# Patient Record
Sex: Male | Born: 1990 | Race: White | Marital: Single | Smoking: Current some day smoker
Health system: Southern US, Community
[De-identification: ages and names within clinical notes are randomized; demographics above are authoritative.]

## PROBLEM LIST (undated history)

## (undated) HISTORY — PX: KNEE SURGERY: SHX244

---

## 2012-03-13 ENCOUNTER — Ambulatory Visit
Admission: RE | Admit: 2012-03-13 | Discharge: 2012-03-13 | Disposition: A | Discharge status: Home / Self Care | Payer: No Typology Code available for payment source | Source: Ambulatory Visit | Attending: Occupational Medicine | Admitting: Occupational Medicine

## 2012-03-13 ENCOUNTER — Other Ambulatory Visit: Payer: Self-pay | Admitting: Occupational Medicine

## 2012-03-13 DIAGNOSIS — Z021 Encounter for pre-employment examination: Secondary | ICD-10-CM

## 2014-05-27 ENCOUNTER — Encounter (HOSPITAL_COMMUNITY): Payer: Self-pay | Admitting: Emergency Medicine

## 2014-05-27 ENCOUNTER — Emergency Department (HOSPITAL_COMMUNITY)
Admission: EM | Admit: 2014-05-27 | Discharge: 2014-05-27 | Disposition: A | Discharge status: Home / Self Care | Payer: Worker's Compensation | Attending: Emergency Medicine | Admitting: Emergency Medicine

## 2014-05-27 DIAGNOSIS — Z23 Encounter for immunization: Secondary | ICD-10-CM | POA: Insufficient documentation

## 2014-05-27 DIAGNOSIS — W268XXA Contact with other sharp object(s), not elsewhere classified, initial encounter: Secondary | ICD-10-CM | POA: Diagnosis not present

## 2014-05-27 DIAGNOSIS — S61209A Unspecified open wound of unspecified finger without damage to nail, initial encounter: Secondary | ICD-10-CM | POA: Insufficient documentation

## 2014-05-27 DIAGNOSIS — S61219A Laceration without foreign body of unspecified finger without damage to nail, initial encounter: Secondary | ICD-10-CM

## 2014-05-27 DIAGNOSIS — F17200 Nicotine dependence, unspecified, uncomplicated: Secondary | ICD-10-CM | POA: Diagnosis not present

## 2014-05-27 DIAGNOSIS — Y9269 Other specified industrial and construction area as the place of occurrence of the external cause: Secondary | ICD-10-CM | POA: Diagnosis not present

## 2014-05-27 DIAGNOSIS — Y9389 Activity, other specified: Secondary | ICD-10-CM | POA: Insufficient documentation

## 2014-05-27 MED ORDER — TETANUS-DIPHTH-ACELL PERTUSSIS 5-2.5-18.5 LF-MCG/0.5 IM SUSP
0.5000 mL | Freq: Once | INTRAMUSCULAR | Status: AC
  Administered 2014-05-27: 0.5 mL via INTRAMUSCULAR
  Filled 2014-05-27: qty 0.5

## 2014-05-27 MED ORDER — LIDOCAINE HCL (PF) 2 % IJ SOLN
INTRAMUSCULAR | Status: AC
  Filled 2014-05-27: qty 2

## 2014-05-27 MED ORDER — LIDOCAINE HCL (CARDIAC) 20 MG/ML IV SOLN: 10.0000 mL | Freq: Once | INTRAVENOUS | Status: DC

## 2014-05-27 MED ORDER — LIDOCAINE HCL (PF) 1 % IJ SOLN: 5.0000 mL | Freq: Once | INTRAMUSCULAR | Status: DC

## 2014-05-27 NOTE — ED Provider Notes (Signed)
CSN: [id number]     Arrival date & time 05/27/14  0129 History   First MD Initiated Contact with Patient 05/27/14 0146     Chief Complaint  Patient presents with  . 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 He is concerned that it may need sutures. He denies that it is continuing to bleed. He denies that he has any pain or that he is unable to move it. He is UTD on his tetanus shot.   History reviewed. No pertinent past medical history. History reviewed. No pertinent past surgical history. No family history on file. History  Substance Use Topics  . Smoking status: Current Some Day Smoker  . Smokeless tobacco: Not on file  . Alcohol Use: Yes    Review of Systems  All other systems reviewed and are negative.     Allergies  Review of patient's allergies indicates no known allergies.  Home Medications   Prior to Admission medications   Not on File   BP 132/79  Pulse 87  Temp(Src) 97.7 F (36.5 C) (Oral)  Resp 17  SpO2 94% Physical Exam  Nursing note and vitals reviewed. Constitutional: He appears well-developed and well-nourished. No distress.  HENT:  Head: Normocephalic and atraumatic.  Eyes: Pupils are equal, round, and reactive to light.  Neck: Normal range of motion. Neck supple.  Cardiovascular: Normal rate and regular rhythm.   Pulmonary/Chest: Effort normal.  Abdominal: Soft.  Musculoskeletal:       Hands: CR < 2 seconds Radial pulses are strong and symmetrical  Neurological: He is alert.  Skin: Skin is warm and dry.    ED Course  Procedures (including critical care time) Labs Review Labs Reviewed - No data to display  Imaging Review No results found.   EKG Interpretation None      MDM   Final diagnoses:  Finger laceration, initial encounter    LACERATION REPAIR Performed by: Dorthula Matas Authorized by: Dorthula Matas Consent: Verbal consent obtained. Risks and benefits: risks, benefits and alternatives were discussed Consent given by: patient Patient identity confirmed: provided demographic data Prepped and Draped in normal sterile fashion Wound explored  Laceration Location: left distal tip index finger  Laceration Length: 1.5 cm  No Foreign Bodies seen or palpated  Anesthesia: local infiltration  Local anesthetic: lidocaine 2% wo epinephrine  Anesthetic total: 3 ml  Irrigation method: syringe Amount of cleaning: standard  Skin closure: sutures  Number of sutures: 5  Technique: simple interrupted  Patient tolerance: Patient tolerated the procedure well with no immediate complications.  UTD tetanus, wound closed, cleaned and wound care instructions given. He will f/u with health clinic with [employer]. Sutures to be removed in 7-10 days  23 y.o.Spencer Gutierrez's evaluation in the Emergency Department is complete. It has been determined that no acute conditions requiring further emergency intervention are present at this time. The patient/guardian have been advised of the diagnosis and plan. We have discussed signs and symptoms that warrant return to the ED, such as changes or worsening in symptoms.  Vital signs are stable at discharge. Filed Vitals:   05/27/14 0207  BP: 132/79  Pulse: 87  Temp: 97.7 F (36.5 C)  Resp: 17    Patient/guardian has voiced understanding and agreed to follow-up with the PCP or specialist.     Dorthula Matas, PA-C 05/30/14 1533

## 2014-05-27 NOTE — ED Notes (Signed)
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

## 2014-05-27 NOTE — ED Notes (Signed)
Sutures in place on index finger of left hand placed by Tiffany, PA, C

## 2014-05-27 NOTE — Discharge Instructions (Signed)

## 2014-05-27 NOTE — ED Notes (Signed)
Tiffany, PA, at the bedside with suture cart

## 2014-05-30 NOTE — ED Provider Notes (Signed)
Medical screening examination/treatment/procedure(s) were performed by non-physician practitioner and as supervising physician I was immediately available for consultation/collaboration.   EKG Interpretation None        Tomasita Crumble, MD 05/30/14 1542

## 2014-11-03 ENCOUNTER — Emergency Department (HOSPITAL_COMMUNITY): Payer: Worker's Compensation

## 2014-11-03 ENCOUNTER — Encounter (HOSPITAL_COMMUNITY): Payer: Self-pay | Admitting: Emergency Medicine

## 2014-11-03 ENCOUNTER — Emergency Department (HOSPITAL_COMMUNITY)
Admission: EM | Admit: 2014-11-03 | Discharge: 2014-11-04 | Disposition: A | Discharge status: Home / Self Care | Payer: Worker's Compensation | Attending: Emergency Medicine | Admitting: Emergency Medicine

## 2014-11-03 DIAGNOSIS — S6992XA Unspecified injury of left wrist, hand and finger(s), initial encounter: Secondary | ICD-10-CM | POA: Diagnosis not present

## 2014-11-03 DIAGNOSIS — S0003XA Contusion of scalp, initial encounter: Secondary | ICD-10-CM | POA: Insufficient documentation

## 2014-11-03 DIAGNOSIS — Z880 Allergy status to penicillin: Secondary | ICD-10-CM | POA: Insufficient documentation

## 2014-11-03 DIAGNOSIS — Y998 Other external cause status: Secondary | ICD-10-CM | POA: Diagnosis not present

## 2014-11-03 DIAGNOSIS — Y92410 Unspecified street and highway as the place of occurrence of the external cause: Secondary | ICD-10-CM | POA: Insufficient documentation

## 2014-11-03 DIAGNOSIS — Z720 Tobacco use: Secondary | ICD-10-CM | POA: Diagnosis not present

## 2014-11-03 DIAGNOSIS — Y939 Activity, unspecified: Secondary | ICD-10-CM | POA: Diagnosis not present

## 2014-11-03 DIAGNOSIS — M79642 Pain in left hand: Secondary | ICD-10-CM

## 2014-11-03 DIAGNOSIS — S0990XA Unspecified injury of head, initial encounter: Secondary | ICD-10-CM | POA: Diagnosis present

## 2014-11-03 NOTE — ED Notes (Signed)
Pt denies any other injuries.
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
## 2014-11-04 NOTE — Discharge Instructions (Signed)
1. Medications: ibuprofen for pain, usual home medications 2. Treatment: rest, drink plenty of fluids, use ice 3. Follow Up: Please followup with your primary doctor in 3 days for discussion of your diagnoses and further evaluation after today's visit; if you do not have a primary care doctor use the resource guide provided to find one; Please return to the ER for worsening symptoms, severe headaches, vomiting, confusion or lethargy    Emergency Department Resource Guide 1) Find a Doctor and Pay Out of Pocket Although you won't have to find out who is covered by your insurance plan, it is a good idea to ask around and get recommendations. You will then need to call the office and see if the doctor you have chosen will accept you as a new patient and what types of options they offer for patients who are self-pay. Some doctors offer discounts or will set up payment plans for their patients who do not have insurance, but you will need to ask so you aren't surprised when you get to your appointment.  2) Contact Your Local Health Department Not all health departments have doctors that can see patients for sick visits, but many do, so it is worth a call to see if yours does. If you don't know where your local health department is, you can check in your phone book. The CDC also has a tool to help you locate your state's health department, and many state websites also have listings of all of their local health departments.  3) Find a Walk-in Clinic If your illness is not likely to be very severe or complicated, you may want to try a walk in clinic. These are popping up all over the country in pharmacies, drugstores, and shopping centers. They're usually staffed by nurse practitioners or physician assistants that have been trained to treat common illnesses and complaints. They're usually fairly quick and inexpensive. However, if you have serious medical issues or chronic medical problems, these are probably not  your best option.  No Primary Care Doctor: - Call Health Connect at  [phone] - they can help you locate a primary care doctor that  accepts your insurance, provides certain services, etc. - Physician Referral Service- [phone]  Chronic Pain Problems: Organization         Address  Phone   Notes  [facility]  [phone] Patients need to be referred by their primary care doctor.   Medication Assistance: Organization         Address  Phone   Notes  [facility] Medication [facility] [address] [place], [place] [address] [phone] --Must be a resident of [facility] -- Must have NO insurance coverage whatsoever (no Medicaid/ Medicare, etc.) -- The pt. MUST have a primary care doctor that directs their care regularly and follows them in the community   [facility]ist  [phone]   [employer]  [phone]    Agencies that provide inexpensive medical care: Organization         Address  Phone   Notes  [facility]  [phone]   [facility]    [phone]   [facility] [address] [place], [place] [address] [phone]   [facility] [address] [place], [place] [phone]   Planned Parenthood    [phone]   [facility]    [phone]   [facility]  [address], [place] Phone:  [phone](336) [phone], Fax:  [phone] Hours of Operation:  9 am - 6 pm, M-F.  Also accepts Medicaid/Medicare and self-pay.  [facility]  [address], [place] Phone: [phone]) [phone], Fax: [phone](336) [phone]. Hours of Operation:  8:30 am - 5:30 pm, M-F.  Also accepts Medicaid and self-pay.  [facility] [place] [address], [place] Phone: [phone](336) [phone]   [facility] [address], [place], [place] [phone], Ext. [phone] Mondays & Thursdays: 7-9 AM.  First  15 patients are seen on a first come, first serve basis.    Medicaid-accepting [facility] Providers:  Organization         Address  Phone   Notes  [facility] [address], [place] [phone]) [phone] Also accepts self-pay patients.  [facility] [address], [place]  [phone]) [phone]   [facility] [address], [place] [phone]) [phone]   [facility] [address] [place] [address], [place] [phone]) [phone]   Renaye RakersVeita Bland [address], [place]   [phone]) [phone] Only accepts [place] Access [place] patients after they have their name applied to their card.   Self-Pay (no insurance) in [facility]:  Organization         Address  Phone   Notes  Sickle Cell Patients, [facility] [address], [place] [phone](336) [phone]   [facility] [address], [place] [phone]   [facility]  [address], [place] [phone]   [facility]/Dr. Osei-Bonsu  [address] [place] [address], [place] or [phone], [place] [phone] Phone number for both [place] and [place] locations is the same.  [facility] [address], [place] [phone]) [phone]   [facility] [place] [address] [place] [address], [place] or [address] [phone]) [phone]   [facility] [address], [place] [phone], phone; [phone]) [phone], fax Sees patients 1st and 3rd Saturday of every month.  Must not qualify for public or private insurance (i.e. Medicaid, Medicare, [place] Health Choice, Veterans' Benefits)  Household income should be no more than 200% of the poverty level The clinic cannot treat you if you are pregnant or think you are pregnant  Sexually transmitted diseases are not treated at the clinic.    Dental  Care: Organization         Address  Phone  Notes  [facility] [address], [place] [phone]) [phone] Accepts children up to age 26 who are enrolled in [place] or [place] Health Choice; pregnant women with a Medicaid card; and children who have applied for Medicaid or [place] Health Choice, but were declined, whose parents can pay a reduced fee at time of service.  [facility] of [facility] [place]  [address], [place] [phone]) [phone] Accepts children up to age 26 who are enrolled in [place] or [place] Health Choice; pregnant women with a Medicaid card; and children who have applied for Medicaid or [place] Health Choice, but were declined, whose parents can pay a reduced fee at time of service.  [facility]  [address]  Mardene Speak [phone] Patients are seen by appointment only. Walk-ins are not accepted. [place] will see patients 41 years of age and older. Monday - Tuesday (8am-5pm) Most Wednesdays (8:30-5pm) $30 per visit, cash only  [facility] Adult Dental Access PROGRAM  [address], [facility] [phone] Patients are seen by appointment only. Walk-ins are not accepted. [place] will see patients 39 years of age and older. One Wednesday Evening (Monthly: Volunteer Based).  $30 per visit, cash only  [place]  [phone] for adults; Children under age 55, call Graduate Pediatric Dentistry at [phone]. Children aged 51-14, please call [phone] to request a pediatric application.  Dental services are provided in all areas of dental care including fillings, crowns and bridges, complete and partial dentures, implants, gum treatment, root canals, and extractions. Preventive care is also provided. Treatment is provided to both adults and children. Patients are selected via a lottery and there is often a waiting list.   [facility] [address], [place]  [phone] [email or web address]   [facility] [address] [place], [place] [phone], Ext. [phone] Second and Fourth Thursday of each month, opens at 6:30 AM; Clinic ends at 9 AM.  Patients are seen on a first-come first-served basis, and a limited number are seen during each clinic.   [facility]  [address] [place], [place] [phone]   Eligibility Requirements You must have lived in [place], [place], or [place] counties for at least the last three months.   You cannot be eligible for state or federal sponsored [employer], including [employer], [place], or [employer].   You generally cannot be eligible for healthcare insurance through your employer.    How to apply: Eligibility screenings are held every Tuesday and Wednesday afternoon from 1:00 pm until 4:00 pm. You do not need an appointment for the interview!  [facility] [address], [place], [place]   [place]  [place] Department  [place]  [phone]    Behavioral Health Resources in the Community: Intensive Outpatient Programs Organization         Address  Phone  Notes  [place]. [address], [place], [place] [phone]   [facility] Outpatient [address] [place], [place]   ADS: [facility] [address], [place], [place]   [place] [address]  [place], [place] or [phone]   Substance Abuse Resources Organization         Address  Phone  Notes  Alcohol and Drug Services  [phone]   [place]  [phone]   [facility]   Chinita Pester  [phone]   Residential & Outpatient Substance Abuse Program  [phone]    Psychological Services Organization         Address  Phone  Notes  [facility]  [place]  [phone]   [place] [address] [place] or [phone]    Mobile Crisis Teams Organization         Address  Phone  Notes  [facility], Mobile Crisis Care Unit  [phone]   [facility]  [address] [place], [place]   [place] Levels  Interior [phone]    Self-Help/Support Groups Organization         Address  Phone             Notes  [facility] [place] - variety of support groups  [place] Call for more information  Narcotics Anonymous (NA), [facility] [address], [employer] [place]  2 meetings at this location   Special educational needs teacher         Address  Phone  Notes  [facility] [place],    [place]  [phone]   [facility]  [address], [place] [phone], [place], [place]   [place], [place] [phone] Admissions: 8am-3pm M-F  Incentives Substance [place] [address]    [place], [place] [phone]   [facility] [address], [place], [place]   [facility] [address]  [place], [place]   [facility] - Intensive Outpatient [address], [place], [place]   [facility] ([place].) [place].,  [place], [place] 1-8383540756 or [phone]   [facility] (RTS) [address], [place], [place] Accepts Medicaid  [facility] [place] [address]  [place] 1-2280100179 Substance Abuse/Addiction Treatment   [facility] Organization         Address  Phone  Notes  [facility]  [phone]   Domenic Schwab, PhD [address] [place], [place]   [phone] or [phone]   [address] [place], [place] [phone]   [facility] [address] [place], [place] [phone] Insurance/Medicaid/sponsorship through [facility] and Families [address], [address] [place]                                    [place], [place] [phone] [place] [address], [place] [phone]    Dr. Adele Schilder  [phone]   [facility] Dept. 1) [address] [place] 2) [place] 3)  [place] 65, [place] 325 004 [phone]  [phone]   [place] [phone] or [phone] (After Hours)

## 2017-02-11 ENCOUNTER — Encounter (HOSPITAL_BASED_OUTPATIENT_CLINIC_OR_DEPARTMENT_OTHER): Payer: Self-pay | Admitting: Emergency Medicine

## 2017-02-11 ENCOUNTER — Emergency Department (HOSPITAL_BASED_OUTPATIENT_CLINIC_OR_DEPARTMENT_OTHER)
Admission: EM | Admit: 2017-02-11 | Discharge: 2017-02-11 | Disposition: A | Discharge status: Home / Self Care | Payer: Worker's Compensation | Attending: Emergency Medicine | Admitting: Emergency Medicine

## 2017-02-11 ENCOUNTER — Emergency Department (HOSPITAL_BASED_OUTPATIENT_CLINIC_OR_DEPARTMENT_OTHER): Payer: Worker's Compensation

## 2017-02-11 DIAGNOSIS — M25512 Pain in left shoulder: Secondary | ICD-10-CM | POA: Diagnosis not present

## 2017-02-11 DIAGNOSIS — Y929 Unspecified place or not applicable: Secondary | ICD-10-CM | POA: Diagnosis not present

## 2017-02-11 DIAGNOSIS — X509XXA Other and unspecified overexertion or strenuous movements or postures, initial encounter: Secondary | ICD-10-CM | POA: Insufficient documentation

## 2017-02-11 DIAGNOSIS — Y999 Unspecified external cause status: Secondary | ICD-10-CM | POA: Insufficient documentation

## 2017-02-11 DIAGNOSIS — F17200 Nicotine dependence, unspecified, uncomplicated: Secondary | ICD-10-CM | POA: Diagnosis not present

## 2017-02-11 DIAGNOSIS — Y939 Activity, unspecified: Secondary | ICD-10-CM | POA: Insufficient documentation

## 2017-02-11 DIAGNOSIS — S4992XA Unspecified injury of left shoulder and upper arm, initial encounter: Secondary | ICD-10-CM | POA: Diagnosis present

## 2017-02-11 MED ORDER — IBUPROFEN 800 MG PO TABS: 800.0000 mg | ORAL_TABLET | Freq: Three times a day (TID) | ORAL | 0 refills | Status: AC | PRN

## 2017-02-11 NOTE — ED Notes (Signed)
Patient transported to X-ray 

## 2017-02-11 NOTE — ED Triage Notes (Signed)
Injury to left shoulder last night while pulling someone out of a car. Pt appears to have full ROM but with pain.  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 Denies need for UDS.

## 2017-02-11 NOTE — ED Notes (Signed)
Pt discharged to home NAD.  

## 2017-02-11 NOTE — Discharge Instructions (Signed)

## 2017-02-11 NOTE — ED Provider Notes (Signed)
Emergency Department Provider Note   I have reviewed the triage vital signs and the nursing notes.  By signing my name below, I, Spencer Gutierrez, attest that this documentation has been prepared under the direction and in the presence of [name], Arlyss Repress, MD. Electronically Signed: Deland Gutierrez, ED Scribe. 02/11/17. 8:49 PM.   HISTORY  Chief Complaint Shoulder Injury  HPI Comments: Spencer Gutierrez is a 26 y.o. male who presents to the Emergency Department complaining of left shoulder pain that radiated down the length of his arm s/p injury that occurred yesterday. The pt reports that he was pulling someone out of his car when the person jerked away from him, and he felt his arm twist the wrong way. Pt has associated mild numbness, tingling, and neck pain. Pt denies fever.  History reviewed. No pertinent past medical history.  There are no active problems to display for this patient.   Past Surgical History:  Procedure Laterality Date  . KNEE SURGERY        Allergies Ceclor [cefaclor] and Penicillins  No family history on file.  Social History Social History  Substance Use Topics  . Smoking status: Current Some Day Smoker  . Smokeless tobacco: Never Used  . Alcohol use Yes     Comment: occ    Review of Systems  Constitutional: No fever/chills Eyes: No visual changes. ENT: No sore throat. Cardiovascular: Denies chest pain. Respiratory: Denies shortness of breath. Gastrointestinal: No abdominal pain.  No nausea, no vomiting.  No diarrhea.  No constipation. Genitourinary: Negative for dysuria. Musculoskeletal: Negative for back pain. Positve for shoulder pain. Skin: Negative for rash. Neurological: Negative for headaches, focal weakness or numbness.  10-point ROS otherwise negative.  ____________________________________________   PHYSICAL EXAM:  VITAL SIGNS: ED Triage Vitals  Enc Vitals Group     BP 02/11/17 1954 136/82     Pulse Rate 02/11/17 1954  67     Resp 02/11/17 1954 17     Temp 02/11/17 1954 98.1 F (36.7 C)     Temp Source 02/11/17 1954 Oral     SpO2 02/11/17 1954 100 %     Weight 02/11/17 1954 185 lb (83.9 kg)     Height 02/11/17 1954 5\' 11"  (1.803 m)     Pain Score 02/11/17 2001 2   Constitutional: Alert and oriented. Well appearing and in no acute distress. Eyes: Conjunctivae are normal. Head: Atraumatic. Nose: No congestion/rhinnorhea. Mouth/Throat: Mucous membranes are moist.  Neck: No stridor.   Cardiovascular: Normal rate, regular rhythm. Good peripheral circulation. Grossly normal heart sounds.   Respiratory: Normal respiratory effort.  No retractions. Lungs CTAB. Musculoskeletal: No lower extremity tenderness nor edema. No gross deformities of extremities. Positive soreness with ROM but ROM is full. No focal tenderness to palpation. Able to resist adduction with left shoulder.  Neurologic:  Normal speech and language. No gross focal neurologic deficits are appreciated.  Skin:  Skin is warm, dry and intact. No rash noted.  ____________________________________________  DIAGNOSTIC STUDIES: Oxygen Saturation is 100% on RA, normal by my interpretation.   COORDINATION OF CARE: 8:49 PM-Discussed next steps with pt. Pt verbalized understanding and is agreeable with the plan.   ____________________________________________  RADIOLOGY  Dg Shoulder Left  Result Date: 02/11/2017 CLINICAL DATA:  Acute onset of left shoulder pain EXAM: LEFT SHOULDER - 2+ VIEW COMPARISON:  None. FINDINGS: There is no evidence of fracture or dislocation. There is no evidence of arthropathy or other focal bone abnormality. Soft tissues are unremarkable. IMPRESSION:  Negative. Electronically Signed   By: Jasmine PangKim  Fujinaga M.D.   On: 02/11/2017 20:33    ____________________________________________   PROCEDURES  Procedure(s) performed:   Procedures  None ____________________________________________   INITIAL IMPRESSION / ASSESSMENT  AND PLAN / ED COURSE  Pertinent labs & imaging results that were available during my care of the patient were reviewed by me and considered in my medical decision making (see chart for details).  Patient presents to the ED with left shoulder soreness. No evidence of fracture or dislocation on plain film. Patient with normal strength in the shoulder Possible rotator cuff strain but doubt full tare. Referred to sports medicine for f/u. Patient to follow up with work regarding return to duty. Disc of radiology provided.   At this time, I do not feel there is any life-threatening condition present. I have reviewed and discussed all results (EKG, imaging, lab, urine as appropriate), exam findings with patient. I have reviewed nursing notes and appropriate previous records.  I feel the patient is safe to be discharged home without further emergent workup. Discussed usual and customary return precautions. Patient and family (if present) verbalize understanding and are comfortable with this plan.  Patient will follow-up with their primary care provider. If they do not have a primary care provider, information for follow-up has been provided to them. All questions have been answered.  ____________________________________________  FINAL CLINICAL IMPRESSION(S) / ED DIAGNOSES  Final diagnoses:  Acute pain of left shoulder     MEDICATIONS GIVEN DURING THIS VISIT:  Medications - No data to display   NEW OUTPATIENT MEDICATIONS STARTED DURING THIS VISIT:  Discharge Medication List as of 02/11/2017  8:50 PM    START taking these medications   Details  ibuprofen (ADVIL,MOTRIN) 800 MG tablet Take 1 tablet (800 mg total) by mouth every 8 (eight) hours as needed., Starting Sun 02/11/2017, Print       I personally performed the services described in this documentation, which was scribed in my presence. The recorded information has been reviewed and is accurate.   Note:  This document was prepared using  Dragon voice recognition software and may include unintentional dictation errors.  Alona BeneJoshua Dalani Mette, MD Emergency Medicine    Satoru Milich, Arlyss RepressJoshua G, MD 02/12/17 1024

## 2017-08-15 ENCOUNTER — Other Ambulatory Visit: Payer: Self-pay | Admitting: Nurse Practitioner

## 2017-08-15 ENCOUNTER — Ambulatory Visit
Admission: RE | Admit: 2017-08-15 | Discharge: 2017-08-15 | Disposition: A | Discharge status: Home / Self Care | Payer: Worker's Compensation | Source: Ambulatory Visit | Attending: Nurse Practitioner | Admitting: Nurse Practitioner

## 2017-08-15 DIAGNOSIS — M25612 Stiffness of left shoulder, not elsewhere classified: Secondary | ICD-10-CM

## 2017-08-15 DIAGNOSIS — M25512 Pain in left shoulder: Secondary | ICD-10-CM

## 2017-09-15 ENCOUNTER — Encounter (HOSPITAL_COMMUNITY): Payer: Self-pay | Admitting: Emergency Medicine

## 2017-09-15 ENCOUNTER — Emergency Department (HOSPITAL_COMMUNITY): Payer: 59

## 2017-09-15 ENCOUNTER — Other Ambulatory Visit: Payer: Self-pay

## 2017-09-15 ENCOUNTER — Emergency Department (HOSPITAL_COMMUNITY)
Admission: EM | Admit: 2017-09-15 | Discharge: 2017-09-15 | Disposition: A | Discharge status: Home / Self Care | Payer: 59 | Attending: Emergency Medicine | Admitting: Emergency Medicine

## 2017-09-15 DIAGNOSIS — G8929 Other chronic pain: Secondary | ICD-10-CM | POA: Insufficient documentation

## 2017-09-15 DIAGNOSIS — M545 Low back pain, unspecified: Secondary | ICD-10-CM

## 2017-09-15 DIAGNOSIS — M419 Scoliosis, unspecified: Secondary | ICD-10-CM

## 2017-09-15 DIAGNOSIS — F17290 Nicotine dependence, other tobacco product, uncomplicated: Secondary | ICD-10-CM | POA: Insufficient documentation

## 2017-09-15 DIAGNOSIS — M62830 Muscle spasm of back: Secondary | ICD-10-CM | POA: Insufficient documentation

## 2017-09-15 DIAGNOSIS — R110 Nausea: Secondary | ICD-10-CM | POA: Insufficient documentation

## 2017-09-15 MED ORDER — CYCLOBENZAPRINE HCL 10 MG PO TABS: 10.0000 mg | ORAL_TABLET | Freq: Three times a day (TID) | ORAL | 0 refills | Status: AC | PRN

## 2017-09-15 MED ORDER — CYCLOBENZAPRINE HCL 10 MG PO TABS
10.0000 mg | ORAL_TABLET | Freq: Once | ORAL | Status: AC
Start: 2017-09-15 — End: 2017-09-15
  Administered 2017-09-15: 10 mg via ORAL
  Filled 2017-09-15: qty 1

## 2017-09-15 MED ORDER — KETOROLAC TROMETHAMINE 30 MG/ML IJ SOLN
30.0000 mg | Freq: Once | INTRAMUSCULAR | Status: AC
  Administered 2017-09-15: 30 mg via INTRAMUSCULAR
  Filled 2017-09-15: qty 1

## 2017-09-15 MED ORDER — ONDANSETRON 4 MG PO TBDP: 4.0000 mg | ORAL_TABLET | Freq: Three times a day (TID) | ORAL | 0 refills | Status: AC | PRN

## 2017-09-15 MED ORDER — HYDROCODONE-ACETAMINOPHEN 5-325 MG PO TABS
1.0000 | ORAL_TABLET | Freq: Once | ORAL | Status: AC
  Administered 2017-09-15: 1 via ORAL
  Filled 2017-09-15: qty 1

## 2017-09-15 MED ORDER — HYDROCODONE-ACETAMINOPHEN 5-325 MG PO TABS: 1.0000 | ORAL_TABLET | Freq: Four times a day (QID) | ORAL | 0 refills | Status: AC | PRN

## 2017-09-15 MED ORDER — NAPROXEN 500 MG PO TABS: 500.0000 mg | ORAL_TABLET | Freq: Two times a day (BID) | ORAL | 0 refills | Status: AC | PRN

## 2017-09-15 MED ORDER — ONDANSETRON 8 MG PO TBDP
8.0000 mg | ORAL_TABLET | Freq: Once | ORAL | Status: AC
  Administered 2017-09-15: 8 mg via ORAL
  Filled 2017-09-15: qty 1

## 2017-09-15 NOTE — ED Notes (Signed)
Pt in radiology at this time. 

## 2017-09-15 NOTE — ED Provider Notes (Signed)
[facility]-EMERGENCY DEPT Provider Note   CSN: [id number] Arrival date & time: 09/15/17  1915     History   Chief Complaint Chief Complaint  Patient presents with  . Back Pain    HPI Spencer Gutierrez is a 27 y.o. male with a PMHx of chronic back pain and nephrolithiasis, who presents to the ED with complaints of acute on chronic low back pain.  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  Over the last 2 days his pain has worsened.  He describes the pain as 8/10 constant sharp/dull/throbbing nonradiating lower back pain that worsens with movement and has been minimally improved with an old Vicodin prescription, Biofreeze, and Tylenol.  He reports mild nausea due to the pain.  He states that his gear for work weighs about 35 pounds, so he does heavy lifting on a daily basis.  He denies any recent trauma or injuries.  He denies history of cancer or IV drug use.  He has been seen by a chiropractor in the past, but has not had an x-ray or any imaging of his back.  He denies that this feels like prior kidney stones.  He denies fevers, chills, CP, SOB, abd pain, V/D/C, hematuria, dysuria, incontinence of urine/stool, saddle anesthesia/cauda equina symptoms, myalgias, arthralgias, numbness, tingling, focal weakness, or any other complaints at this time.    The history is provided by the patient and medical records. No language interpreter was used.  Back Pain   This is a chronic problem. The current episode started 2 days ago. The problem occurs constantly. The problem has not changed since onset.The pain is associated with lifting heavy objects. The pain is present in the lumbar spine. Quality: sharp dull throbbing. The pain does not radiate. The pain is at a severity of 8/10. The pain is moderate. The pain is the same all the time.  Pertinent negatives include no chest pain, no fever, no numbness, no abdominal pain, no bowel incontinence, no perianal numbness, no bladder incontinence, no dysuria, no paresthesias, no paresis, no tingling and no weakness. Treatments tried: biofreeze, tylenol, and old vicodin rx. The treatment provided mild relief.    History reviewed. No pertinent past medical history.  There are no active problems to display for this patient.   Past Surgical History:  Procedure Laterality Date  . KNEE SURGERY         Home Medications    Prior to Admission medications   Medication Sig Start Date End Date Taking? Authorizing Provider  ibuprofen (ADVIL,MOTRIN) 800 MG tablet Take 1 tablet (800 mg total) by mouth every 8 (eight) hours as needed. 02/11/17   Long, Arlyss Repress, MD    Family History History reviewed. No pertinent family history.  Social History Social History   Tobacco Use  . Smoking status: Current Some Day Smoker    Types: Cigars  . Smokeless tobacco: Never Used  Substance Use Topics  . Alcohol use: Yes    Comment: occ  . Drug use: No     Allergies   Ceclor [cefaclor] and Penicillins   Review of Systems Review of Systems  Constitutional: Negative for chills and fever.  Respiratory: Negative for shortness of breath.   Cardiovascular: Negative for chest pain.  Gastrointestinal: Positive for nausea. Negative for abdominal pain, bowel incontinence, constipation, diarrhea and vomiting.  Genitourinary: Negative for bladder incontinence, difficulty urinating (no incontinence), dysuria and hematuria.  Musculoskeletal: Positive for back pain. Negative for arthralgias and myalgias.  Skin: Negative for color change.  Allergic/Immunologic: Negative for immunocompromised state.  Neurological: Negative for tingling, weakness, numbness and paresthesias.  Psychiatric/Behavioral: Negative for confusion.   All other systems reviewed and are negative for acute change except as noted in  the HPI.    Physical Exam Updated Vital Signs BP (!) 139/91 (BP Location: Right Arm)   Pulse 94   Temp 98.6 F (37 C) (Oral)   Resp 18   Ht 5\' 10"  (1.778 m)   Wt 81.6 kg (180 lb)   SpO2 98%   BMI 25.83 kg/m   Physical Exam  Constitutional: He is oriented to person, place, and time. Vital signs are normal. He appears well-developed and well-nourished.  Non-toxic appearance. No distress.  Afebrile, nontoxic, NAD  HENT:  Head: Normocephalic and atraumatic.  Mouth/Throat: Oropharynx is clear and moist and mucous membranes are normal.  Eyes: Conjunctivae and EOM are normal. Right eye exhibits no discharge. Left eye exhibits no discharge.  Neck: Normal range of motion. Neck supple.  Cardiovascular: Normal rate, regular rhythm, normal heart sounds and intact distal pulses. Exam reveals no gallop and no friction rub.  No murmur heard. Pulmonary/Chest: Effort normal and breath sounds normal. No respiratory distress. He has no decreased breath sounds. He has no wheezes. He has no rhonchi. He has no rales.  Abdominal: Soft. Normal appearance and bowel sounds are normal. He exhibits no distension. There is no tenderness. There is no rigidity, no rebound, no guarding, no CVA tenderness, no tenderness at McBurney's point and negative Murphy's sign.  Musculoskeletal: Normal range of motion.       Lumbar back: He exhibits tenderness, bony tenderness and spasm. He exhibits normal range of motion and no deformity.  Lumbar spine with FROM intact with mild diffuse spinous process TTP, no bony stepoffs or deformities, with mild b/l paraspinous muscle TTP and muscle spasms. Strength and sensation grossly intact in all extremities, negative SLR bilaterally, gait steady and nonantalgic. No overlying skin changes. Distal pulses intact.   Neurological: He is alert and oriented to person, place, and time. He has normal strength. No sensory deficit.  Skin: Skin is warm, dry and intact. No rash noted.    Psychiatric: He has a normal mood and affect.  Nursing note and vitals reviewed.    ED Treatments / Results  Labs (all labs ordered are listed, but only abnormal results are displayed) Labs Reviewed - No data to display  EKG  EKG Interpretation None       Radiology Dg Lumbar Spine Complete  Result Date: 09/15/2017 CLINICAL DATA:  Lower back pain for 2 days. EXAM: LUMBAR SPINE - COMPLETE 4+ VIEW COMPARISON:  None. FINDINGS: There is no evidence of lumbar spine fracture. There is scoliosis of spine. Intervertebral disc spaces are maintained. IMPRESSION: Scoliosis of spine.  No acute abnormality noted. Electronically Signed   By: Sherian Rein M.D.   On: 09/15/2017 20:09    Procedures Procedures (including critical care time)  Medications Ordered in ED Medications  ketorolac (TORADOL) 30 MG/ML injection 30 mg (30 mg Intramuscular Given 09/15/17 2006)  cyclobenzaprine (FLEXERIL) tablet 10 mg (10 mg Oral Given 09/15/17 2006)  HYDROcodone-acetaminophen (NORCO/VICODIN) 5-325 MG per tablet 1 tablet (1 tablet Oral Given 09/15/17 2006)  ondansetron (ZOFRAN-ODT) disintegrating tablet 8 mg (8 mg Oral Given 09/15/17 2006)     Initial Impression /  Assessment and Plan / ED Course  I have reviewed the triage vital signs and the nursing notes.  Pertinent labs & imaging results that were available during my care of the patient were reviewed by me and considered in my medical decision making (see chart for details).     27 y.o. male here with acute on chronic low back pain worse x2 days; works as a Emergency planning/management officerpolice officer, and does a lot of heavy lifting daily. Had an injury 7747yrs ago and hurt his back, has had issues every since. On exam, no abdominal or flank tenderness, mild diffuse midline lumbar spinal tenderness and mild paraspinous muscle TTP and spasms. No red flag s/s of low back pain. No s/s of central cord compression or cauda equina. Lower extremities are neurovascularly intact and patient is  ambulating without difficulty. No urinary complaints. Will get xray but doubt need for labs/other imaging studies/etc. Will give pain meds, zofran, and reassess shortly  9:29 PM Pt feeling much better. Xray negative aside from scoliosis. Acute pain likely muscle strain, doubt need for further emergent work up at this time. Will send home with zofran rx, as well as meds/instructions outlined below:  Patient was counseled on back pain precautions and told to do activity as tolerated but do not lift, push, or pull heavy objects more than 10 pounds for the next week. Patient counseled to use ice or heat on back for no longer than 15 minutes every hour.   Rx given for muscle relaxer and counseled on proper use of muscle relaxant medication. Urged patient not to drink alcohol, drive, or perform any other activities that requires focus while taking these medications. Rx for naprosyn given. Rx given for narcotic pain medicine and counseled on proper use of narcotic pain medications. Told that they can increase to every 4 hrs if needed while pain is worse. Counseled not to combine this medication with others containing tylenol. Counseled about not operating machinery or drinking alcohol, etc, while taking this medication.    Patient urged to follow-up with PCP in 1-2wks for recheck, or sooner if pain does not improve with treatment and rest or if pain becomes recurrent. Urged to return with worsening severe pain, loss of bowel or bladder control, trouble walking. The patient verbalizes understanding and agrees with the plan.   [place] reviewed prior to dispensing controlled substance medications, and 2 year search was notable for: none found. Risks/benefits/alternatives and expectations discussed regarding controlled substances. Side effects of medications discussed. Informed consent obtained.    Final Clinical Impressions(s) / ED Diagnoses   Final diagnoses:  Chronic midline low back pain without  sciatica  Muscle spasm of back  Nausea  Scoliosis of lumbar spine, unspecified scoliosis type    ED Discharge Orders        Ordered    ondansetron (ZOFRAN ODT) 4 MG disintegrating tablet  Every 8 hours PRN     09/15/17 2129    cyclobenzaprine (FLEXERIL) 10 MG tablet  3 times daily PRN     09/15/17 2129    naproxen (NAPROSYN) 500 MG tablet  2 times daily PRN     09/15/17 2129    HYDROcodone-acetaminophen (NORCO) 5-325 MG tablet  Every 6 hours PRN     09/15/17 [address]2129       Clotiel Troop, HoldenMercedes, [place]PA-C 09/15/17 2129    Lorre NickAllen, Anthony, MD 09/16/17 1601

## 2017-09-15 NOTE — Discharge Instructions (Signed)
Your xray today showed mild scoliosis but was otherwise reassuring/unremarkable. See below regarding instructions for your back pain. Use zofran as directed as needed for nausea. Stay well hydrated. Follow up with your regular doctor in 1-2 weeks for recheck of symptoms. Return to the ER for emergent changes or worsening symptoms as outlined below.   Back Pain: Your back pain should be treated with medicines such as ibuprofen or aleve and this back pain should get better over the next 2 weeks.  However if you develop severe or worsening pain, low back pain with fever, numbness, weakness or inability to walk or urinate, you should return to the ER immediately.  Please follow up with your doctor this week for a recheck if still having symptoms.  Avoid heavy lifting over 10 pounds over the next two weeks.  Low back pain is discomfort in the lower back that may be due to injuries to muscles and ligaments around the spine.  Occasionally, it may be caused by a a problem to a part of the spine called a disc.  The pain may last several days or a week;  However, most patients get completely well in 4 weeks.  Self - care:  The application of heat can help soothe the pain.  Maintaining your daily activities, including walking, is encourged, as it will help you get better faster than just staying in bed. Perform gentle stretching as discussed. Drink plenty of fluids.  Medications are also useful to help with pain control.  A commonly prescribed medication includes vicodin.  Do not drive or operate heavy machinery while taking this medication.  Non steroidal anti inflammatory medications including Ibuprofen and naproxen;  These medications help both pain and swelling and are very useful in treating back pain.  They should be taken with food, as they can cause stomach upset, and more seriously, stomach bleeding.    Muscle relaxants:  These medications can help with muscle tightness that is a cause of lower back pain.   Most of these medications can cause drowsiness, and it is not safe to drive or use dangerous machinery while taking them.  SEEK IMMEDIATE MEDICAL ATTENTION IF: New numbness, tingling, weakness, or problem with the use of your arms or legs.  Severe back pain not relieved with medications.  Difficulty with or loss of control of your bowel or bladder control.  Increasing pain in any areas of the body (such as chest or abdominal pain).  Shortness of breath, dizziness or fainting.  Nausea (feeling sick to your stomach), vomiting, fever, or sweats.  You will need to follow up with  Your primary healthcare provider in 1-2 weeks for reassessment.

## 2017-09-15 NOTE — ED Triage Notes (Signed)
Pt reports pain in lower back that has been occurring for the last 2 days and has gotten worse over the last several days. Denies radiation into legs and denies any urinary symptoms.

## 2018-08-03 IMAGING — CR DG LUMBAR SPINE COMPLETE 4+V
5 series · 5 of 5 positions shown · non-contrast
Comparison: None.

CLINICAL DATA: Lower back pain for 2 days.

EXAM:
LUMBAR SPINE - COMPLETE 4+ VIEW

[w lumbar spine ap (1 of 4)]
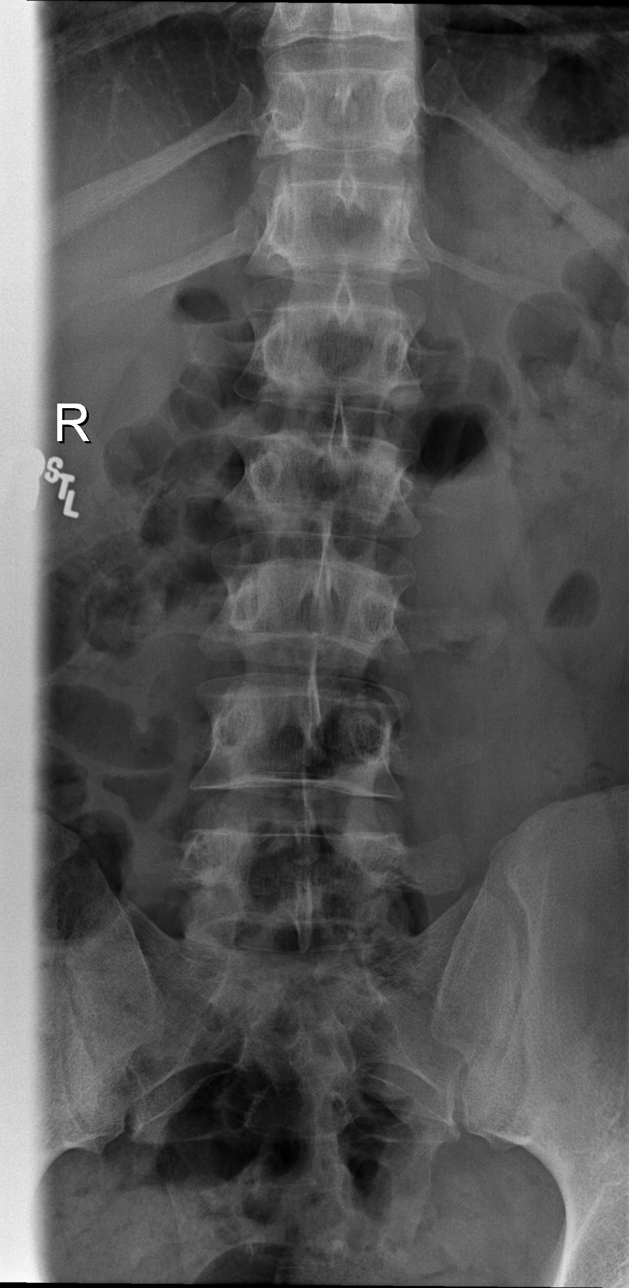

[w lumbar spine ap (2 of 4)]
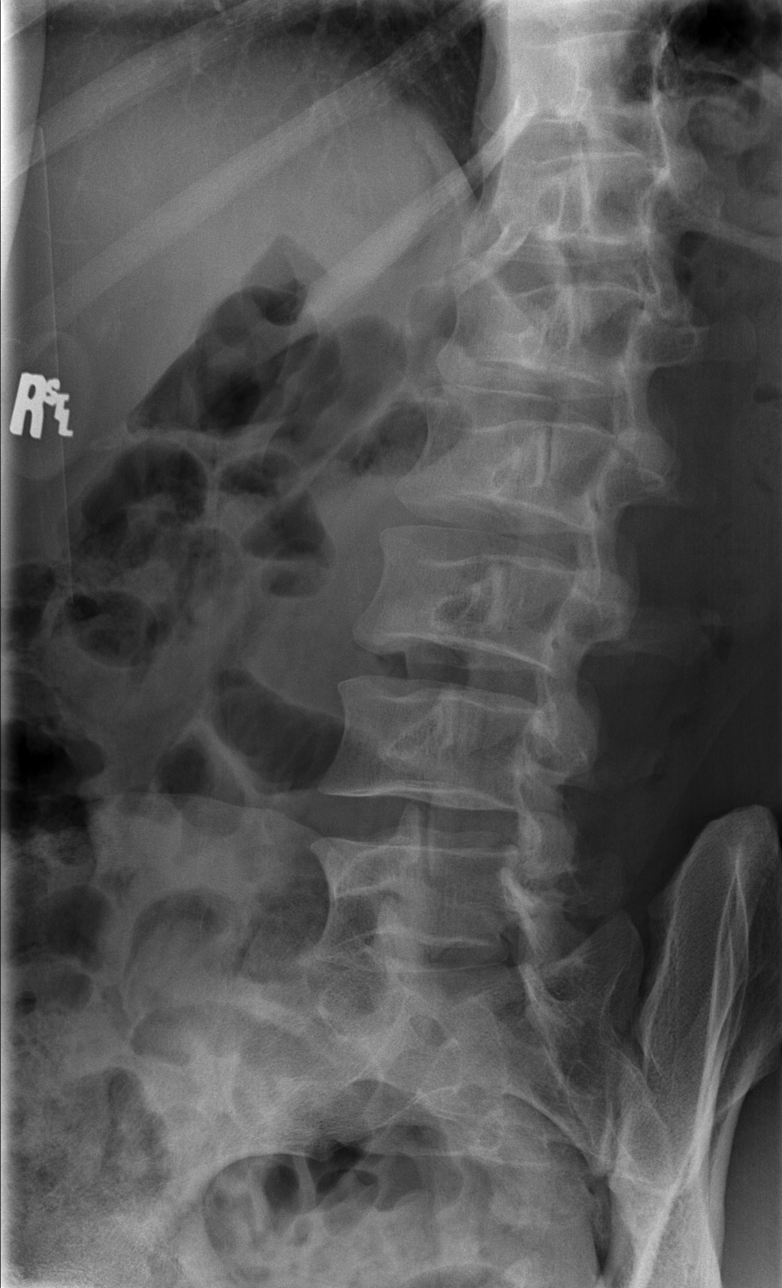

[w lumbar spine ap (3 of 4)]
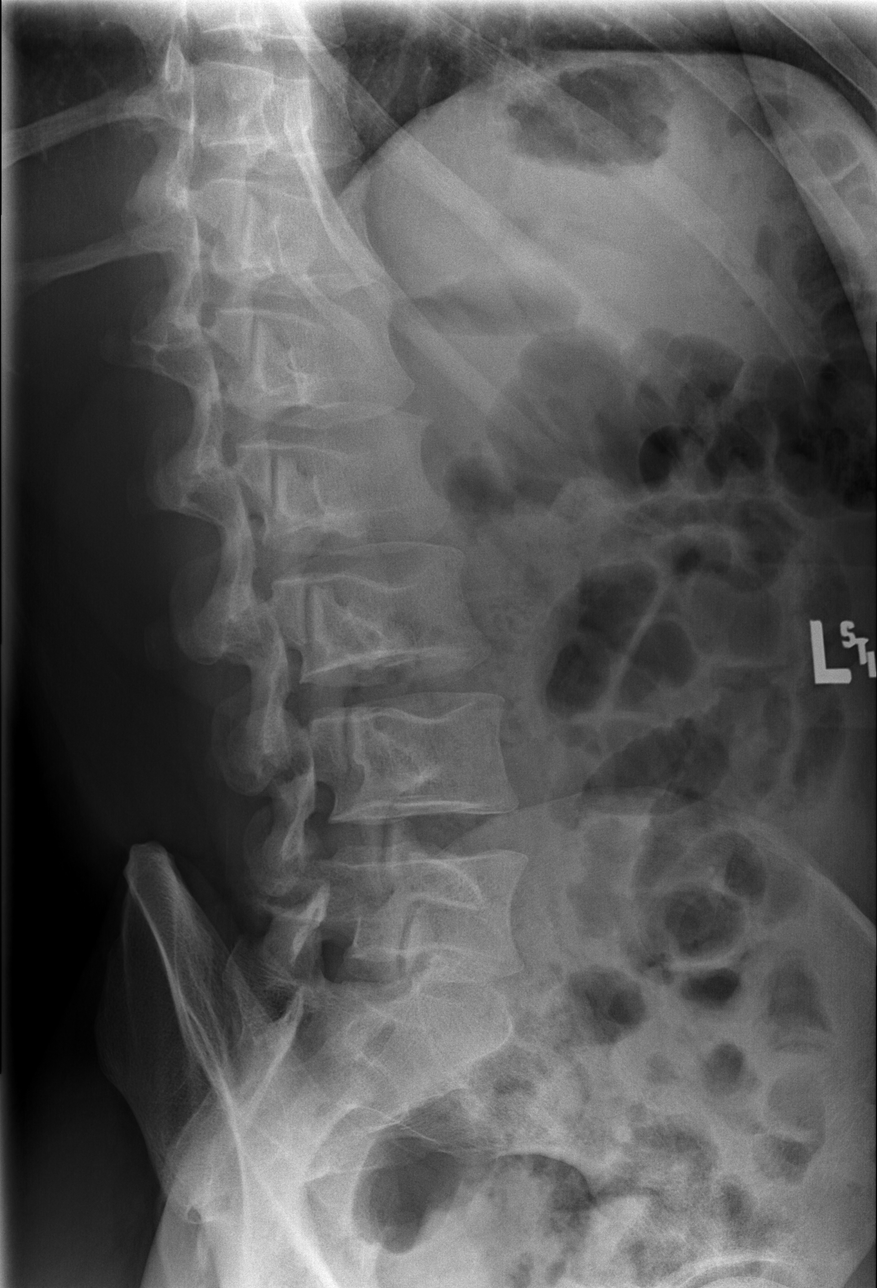

[w lumbar spine ap (4 of 4)]
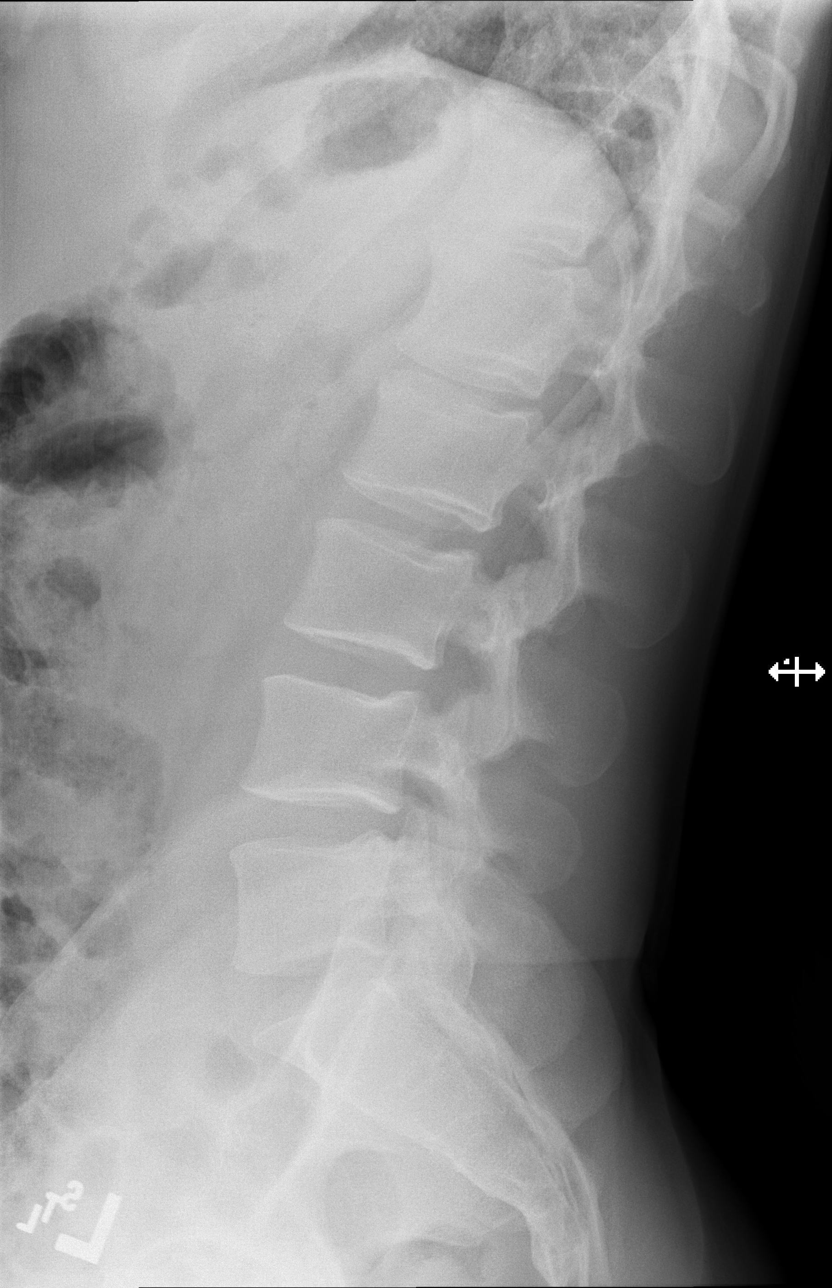

[w lumbar spine lat]
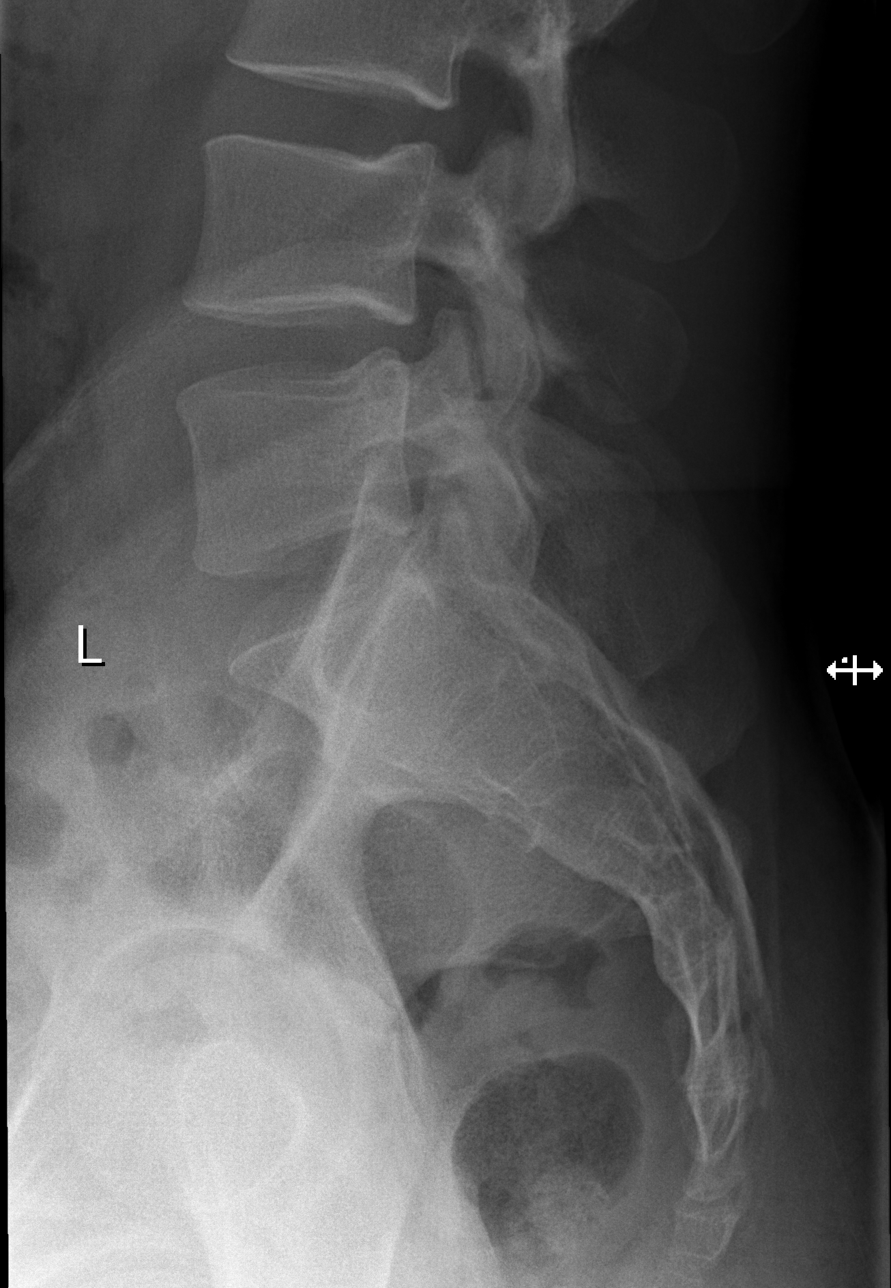

[5 of 5 positions shown; findings below may reference images not displayed]

FINDINGS: There is no evidence of lumbar spine fracture. There is scoliosis of
spine. Intervertebral disc spaces are maintained.
IMPRESSION: Scoliosis of spine.  No acute abnormality noted.
